# Patient Record
Sex: Female | Born: 1990 | Race: White | Hispanic: No | Marital: Single | Smoking: Former smoker
Health system: Southern US, Community
[De-identification: ages and names within clinical notes are randomized; demographics above are authoritative.]

## PROBLEM LIST (undated history)

## (undated) HISTORY — PX: NO PAST SURGERIES: SHX2092

---

## 2016-04-17 LAB — HM PAP SMEAR

## 2016-04-24 LAB — HM PAP SMEAR: HM PAP: POSITIVE

## 2016-07-22 DIAGNOSIS — N879 Dysplasia of cervix uteri, unspecified: Secondary | ICD-10-CM | POA: Insufficient documentation

## 2017-06-28 ENCOUNTER — Ambulatory Visit: Payer: Self-pay | Admitting: Physician Assistant

## 2017-07-14 ENCOUNTER — Ambulatory Visit (INDEPENDENT_AMBULATORY_CARE_PROVIDER_SITE_OTHER): Payer: Self-pay | Admitting: Physician Assistant

## 2017-07-14 ENCOUNTER — Encounter: Payer: Self-pay | Admitting: Physician Assistant

## 2017-07-14 VITALS — BP 126/74 | HR 104 | Temp 98.8°F | Resp 16 | Ht 65.0 in | Wt 121.0 lb

## 2017-07-14 DIAGNOSIS — Z598 Other problems related to housing and economic circumstances: Secondary | ICD-10-CM

## 2017-07-14 DIAGNOSIS — Z599 Problem related to housing and economic circumstances, unspecified: Secondary | ICD-10-CM

## 2017-07-14 DIAGNOSIS — F419 Anxiety disorder, unspecified: Secondary | ICD-10-CM

## 2017-07-14 DIAGNOSIS — B1920 Unspecified viral hepatitis C without hepatic coma: Secondary | ICD-10-CM

## 2017-07-14 MED ORDER — ESCITALOPRAM OXALATE 10 MG PO TABS: 10.0000 mg | ORAL_TABLET | Freq: Every day | ORAL | 0 refills | Status: DC

## 2017-07-14 NOTE — Patient Instructions (Signed)
Hepatitis C Hepatitis C is a viral infection of the liver. It can lead to scarring of the liver (cirrhosis), liver failure, or liver cancer. Hepatitis C may go undetected for months or years because people with the infection may not have symptoms, or they may have only mild symptoms. What are the causes? This condition is caused by the hepatitis C virus (HCV). The virus can spread from person to person (is contagious) through:  Blood.  Childbirth. A woman who has hepatitis C can pass it to her baby during birth.  Bodily fluids, such as breast milk, tears, semen, vaginal fluids, and saliva.  Blood transfusions or organ transplants done in the [place] before 1992.  What increases the risk? The following factors may make you more likely to develop this condition:  Having contact with unclean (contaminated) needles or syringes. This may result from: ? Acupuncture. ? Tattoing. ? Body piercing. ? Injecting drugs.  Having unprotected sex with someone who is infected.  Needing treatment to filter your blood (kidney dialysis).  Having HIV (human immunodeficiency virus) or AIDS (acquired immunodeficiency syndrome).  Working in a job that involves contact with blood or bodily fluids, such as health care.  What are the signs or symptoms? Symptoms of this condition include:  Fatigue.  Loss of appetite.  Nausea.  Vomiting.  Abdominal pain.  Dark yellow urine.  Yellowish skin and eyes (jaundice).  Itchy skin.  Clay-colored bowel movements.  Joint pain.  Bleeding and bruising easily.  Fluid building up in your stomach (ascites).  In some cases, you may not have any symptoms. How is this diagnosed? This condition is diagnosed with:  Blood tests.  Other tests to check how well your liver is functioning. They may include: ? Magnetic resonance elastography (MRE). This imaging test uses MRIs and sound waves to measure liver stiffness. ? Transient elastography. This  imaging test uses ultrasounds to measure liver stiffness. ? Liver biopsy. This test requires taking a small tissue sample from your liver to examine it under a microscope.  How is this treated? Your health care provider may perform noninvasive tests or a liver biopsy to help decide the best course of treatment. Treatment may include:  Antiviral medicines and other medicines.  Follow-up treatments every 6-12 months for infections or other liver conditions.  Receiving a donated liver (liver transplant).  Follow these instructions at home: Medicines  Take over-the-counter and prescription medicines only as told by your health care provider.  Take your antiviral medicine as told by your health care provider. Do not stop taking the antiviral even if you start to feel better.  Do not take any medicines unless approved by your health care provider, including over-the-counter medicines and birth control pills. Activity  Rest as needed.  Do not have sex unless approved by your health care provider.  Ask your health care provider when you may return to school or work. Eating and drinking  Eat a balanced diet with plenty of fruits and vegetables, whole grains, and lowfat (lean) meats or non-meat proteins (such as beans or tofu).  Drink enough fluids to keep your urine clear or pale yellow.  Do not drink alcohol. General instructions  Do not share toothbrushes, nail clippers, or razors.  Wash your hands frequently with soap and water. If soap and water are not available, use hand sanitizer.  Cover any cuts or open sores on your skin to prevent spreading the virus.  Keep all follow-up visits as told by your health care   provider. This is important. You may need follow-up visits every 6-12 months. How is this prevented? There is no vaccine for hepatitis C. The only way to prevent the disease is to reduce the risk of exposure to the virus. Make sure you:  Wash your hands frequently with  soap and water. If soap and water are not available, use hand sanitizer.  Do not share needles or syringes.  Practice safe sex and use condoms.  Avoid handling blood or bodily fluids without gloves or other protection.  Avoid getting tattoos or piercings in shops or other locations that are not clean.  Contact a health care provider if:  You have a fever.  You develop abdominal pain.  You pass dark urine.  You pass clay-colored stools.  You develop joint pain. Get help right away if:  You have increasing fatigue or weakness.  You lose your appetite.  You cannot eat or drink without vomiting.  You develop jaundice or your jaundice gets worse.  You bruise or bleed easily. Summary  Hepatitis C is a viral infection of the liver. It can lead to scarring of the liver (cirrhosis), liver failure, or liver cancer.  The hepatitis C virus (HCV) causes this condition. The virus can pass from person to person (is contagious).  You should not take any medicines unless approved by your health care provider. This includes over-the-counter medicines and birth control pills. This information is not intended to replace advice given to you by your health care provider. Make sure you discuss any questions you have with your health care provider. Document Released: 02/14/2000 Document Revised: 03/24/2016 Document Reviewed: 03/24/2016 Elsevier Interactive Patient Education  2018 Elsevier Inc.  

## 2017-07-14 NOTE — Progress Notes (Signed)
Patient: Angel Skinner Female    DOB: [birth year]   27 y.o.   MRN: [id number] Visit Date: 07/16/2017  Today's Provider: Trey Sailors, PA-C   Chief Complaint  Patient presents with  . Establish Care  . Anxiety  . Hepatitis C   Subjective:    Angel Skinner is a 27 y/o woman who presents today to establish care. She is living in [place] with her boyfriend of over a year, relationship going well. She has one 29 year old son. She works in Biomedical engineer.   She reports in 2017 she tested positive for hepatitis C. 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 She reports she is not using any drugs now. She reports she was not treated for hepatitis C and she did not have any further workup for this. She reports right now she does not have health insurance.   She also reports some ongoing depression and anxiety. She was in a troublesome living situation previously when she was using IV heroin. She also recently lost her grandfather, and it seems her grandmother is in poor health as well. She reports sadness, crying, lack of motivation. She has not been treated for anxiety or depression in the past. She is interested in this as well as counseling in the community.  She does not recall when her prior PAP smear was.    Anxiety  Presents for initial visit. The problem has been gradually worsening. Symptoms include decreased concentration, depressed mood, excessive worry, nervous/anxious behavior and panic. Patient reports no chest pain, confusion, insomnia, muscle tension, nausea, palpitations, shortness of breath or suicidal ideas.   Past treatments include nothing.       No Known Allergies   Current Outpatient Medications:  .  escitalopram (LEXAPRO) 10 MG tablet, Take 1 tablet (10 mg total) by mouth daily., Disp: 90 tablet, Rfl: 0  Review of Systems  Constitutional: Positive for fatigue.  HENT: Negative.   Eyes: Negative.   Respiratory: Negative.  Negative for  shortness of breath.   Cardiovascular: Negative.  Negative for chest pain and palpitations.  Gastrointestinal: Negative.  Negative for nausea.  Endocrine: Negative.   Genitourinary: Negative.   Musculoskeletal: Negative.   Skin: Negative.   Allergic/Immunologic: Negative.   Neurological: Negative.   Hematological: Negative.   Psychiatric/Behavioral: Positive for decreased concentration. Negative for agitation, behavioral problems, confusion, dysphoric mood, hallucinations, self-injury, sleep disturbance and suicidal ideas. The patient is nervous/anxious. The patient does not have insomnia and is not hyperactive.    Family History  Problem Relation Age of Onset  . Breast cancer Mother        Late 30-40's  . Healthy Brother   . Breast cancer Maternal Grandmother   . Cirrhosis Maternal Grandmother   . Colon cancer Maternal Grandmother   . Healthy Brother    Past Surgical History:  Procedure Laterality Date  . NO PAST SURGERIES      Social History   Tobacco Use  . Smoking status: Former Smoker    Types: Cigarettes    Last attempt to quit: 03/02/2016    Years since quitting: 1.3  . Smokeless tobacco: Never Used  Substance Use Topics  . Alcohol use: Yes    Frequency: Never    Comment: rarely   Objective:   BP 126/74 (BP Location: Right Arm, Patient Position: Sitting, Cuff Size: Normal)   Pulse (!) 104   Temp 98.8 F (37.1 C) (Oral)   Resp 16  Ht  (1.651 m)   Wt 121 lb (54.9 kg)   BMI 20.14 kg/m  Vitals:   07/14/17 1522  BP: 126/74  Pulse: (!) 104  Resp: 16  Temp: 98.8 F (37.1 C)  TempSrc: Oral  Weight: 121 lb (54.9 kg)  Height:  (1.651 m)     Physical Exam  Constitutional: She is oriented to person, place, and time. She appears well-developed and well-nourished.  Eyes: Conjunctivae are normal. Right eye exhibits no discharge. Left eye exhibits no discharge.  No scleral icterus.   Cardiovascular: Normal rate and regular rhythm.  Pulmonary/Chest:  Effort normal and breath sounds normal.  Abdominal: Soft. Bowel sounds are normal. She exhibits no distension. There is no tenderness.  Neurological: She is alert and oriented to person, place, and time.  Skin: Skin is warm and dry.  No jaundice.  Psychiatric: She has a normal mood and affect. Her behavior is normal.        Assessment & Plan:     1. Hepatitis C virus infection without hepatic coma, unspecified chronicity  Further workup as below. Will refer to infectious disease center in [place], which has better assistance with payment than local treatment. C3 referral for help with insurance and also local counseling.  - Ambulatory referral to [facility] - Comprehensive Metabolic Panel (CMET) - CBC with Differential - HIV antibody (with reflex) - Hepatitis c antibody (reflex) - Hepatitis B surface antibody - Hepatitis B Surface AntiGEN - Hepatitis B Core Antibody, total - INR/PT - Ambulatory referral to Infectious Disease  2. Financial difficulties  - Ambulatory referral to [facility]  3. Anxiety  - escitalopram (LEXAPRO) 10 MG tablet; Take 1 tablet (10 mg total) by mouth daily.  Dispense: 90 tablet; Refill: 0  Return in about 1 month (around 08/14/2017) for anxiety .  The entirety of the information documented in the History of Present Illness, Review of Systems and Physical Exam were personally obtained by me. Portions of this information were initially documented by Kavin Leech, CMA and reviewed by me for thoroughness and accuracy.        Trey Sailors, PA-C  [facility]

## 2017-07-15 LAB — CBC WITH DIFFERENTIAL/PLATELET
Basophils Absolute: 0 10*3/uL (ref 0.0–0.2)
Basos: 0 %
EOS (ABSOLUTE): 0.1 10*3/uL (ref 0.0–0.4)
Eos: 1 %
Hematocrit: 38.2 % (ref 34.0–46.6)
Hemoglobin: 12.7 g/dL (ref 11.1–15.9)
Immature Grans (Abs): 0 10*3/uL (ref 0.0–0.1)
Immature Granulocytes: 0 %
Lymphocytes Absolute: 2.2 10*3/uL (ref 0.7–3.1)
Lymphs: 34 %
MCH: 30 pg (ref 26.6–33.0)
MCHC: 33.2 g/dL (ref 31.5–35.7)
MCV: 90 fL (ref 79–97)
Monocytes Absolute: 0.3 10*3/uL (ref 0.1–0.9)
Monocytes: 4 %
Neutrophils Absolute: 3.8 10*3/uL (ref 1.4–7.0)
Neutrophils: 61 %
Platelets: 307 10*3/uL (ref 150–379)
RBC: 4.24 x10E6/uL (ref 3.77–5.28)
RDW: 13.4 % (ref 12.3–15.4)
WBC: 6.3 10*3/uL (ref 3.4–10.8)

## 2017-07-15 LAB — COMPREHENSIVE METABOLIC PANEL
ALT: 167 IU/L — ABNORMAL HIGH (ref 0–32)
AST: 70 IU/L — ABNORMAL HIGH (ref 0–40)
Albumin/Globulin Ratio: 1.7 (ref 1.2–2.2)
Albumin: 4.7 g/dL (ref 3.5–5.5)
Alkaline Phosphatase: 51 IU/L (ref 39–117)
BUN/Creatinine Ratio: 19 (ref 9–23)
BUN: 12 mg/dL (ref 6–20)
Bilirubin Total: <0.2 mg/dL (ref 0.0–1.2)
CO2: 20 mmol/L (ref 20–29)
Calcium: 10.6 mg/dL — ABNORMAL HIGH (ref 8.7–10.2)
Chloride: 106 mmol/L (ref 96–106)
Creatinine, Ser: 0.64 mg/dL (ref 0.57–1.00)
GFR calc Af Amer: 139 mL/min/{1.73_m2} (ref >59)
GFR calc non Af Amer: 121 mL/min/{1.73_m2} (ref >59)
Globulin, Total: 2.8 g/dL (ref 1.5–4.5)
Glucose: 103 mg/dL — ABNORMAL HIGH (ref 65–99)
Potassium: 4.1 mmol/L (ref 3.5–5.2)
Sodium: 143 mmol/L (ref 134–144)
Total Protein: 7.5 g/dL (ref 6.0–8.5)

## 2017-07-15 LAB — HEPATITIS B CORE ANTIBODY, TOTAL: Hep B Core Total Ab: NEGATIVE

## 2017-07-15 LAB — HIV ANTIBODY (ROUTINE TESTING W REFLEX): HIV Screen 4th Generation wRfx: NONREACTIVE

## 2017-07-15 LAB — HEPATITIS B SURFACE ANTIBODY,QUALITATIVE: Hep B Surface Ab, Qual: REACTIVE

## 2017-07-15 LAB — HEPATITIS B SURFACE ANTIGEN: Hepatitis B Surface Ag: NEGATIVE

## 2017-07-15 LAB — COMMENT2 - HEP PANEL

## 2017-07-15 LAB — PROTIME-INR
INR: 1.1 (ref 0.8–1.2)
Prothrombin Time: 11.1 s (ref 9.1–12.0)

## 2017-07-15 LAB — HEPATITIS C ANTIBODY (REFLEX): HCV Ab: >11 s/co ratio — ABNORMAL HIGH (ref 0.0–0.9)

## 2017-07-20 ENCOUNTER — Encounter: Payer: Self-pay | Admitting: Physician Assistant

## 2017-07-20 LAB — HCV RT-PCR, QUANT (NON-GRAPH)

## 2017-07-20 LAB — SPECIMEN STATUS REPORT

## 2017-07-20 LAB — HCV AB W REFLEX TO QUANT PCR: HCV Ab: >11 {s_co_ratio} — ABNORMAL HIGH (ref 0.0–0.9)

## 2017-07-22 ENCOUNTER — Telehealth: Payer: Self-pay | Admitting: *Deleted

## 2017-07-22 NOTE — Telephone Encounter (Signed)
-----   Message from Trey Sailors, [place] sent at 07/22/2017 11:25 AM EDT ----- Hepatitis C antibody came back positive, which patient expected. There wasn't enough blood to run RNA to confirm the viral load. It looks like she has an appointment with infectious disease clinic on 07/29/2017 and she will likely get follow up labs and ultrasound there. Her liver enzymes were elevated. CBC normal. HIV negative. Hepatitis B labs consistent with previous vaccination. INR normal.

## 2017-07-22 NOTE — Telephone Encounter (Signed)
No answer and no vm

## 2017-07-28 NOTE — Telephone Encounter (Signed)
Pt advised.   Thanks,   -Yulitza Shorts  

## 2017-07-29 ENCOUNTER — Other Ambulatory Visit: Payer: Self-pay | Admitting: Physician Assistant

## 2017-07-29 ENCOUNTER — Ambulatory Visit (INDEPENDENT_AMBULATORY_CARE_PROVIDER_SITE_OTHER): Payer: Medicaid Other | Admitting: Internal Medicine

## 2017-07-29 ENCOUNTER — Encounter: Payer: Self-pay | Admitting: Internal Medicine

## 2017-07-29 DIAGNOSIS — R87612 Low grade squamous intraepithelial lesion on cytologic smear of cervix (LGSIL): Secondary | ICD-10-CM

## 2017-07-29 DIAGNOSIS — N870 Mild cervical dysplasia: Secondary | ICD-10-CM

## 2017-07-29 DIAGNOSIS — B182 Chronic viral hepatitis C: Secondary | ICD-10-CM | POA: Insufficient documentation

## 2017-07-29 NOTE — Progress Notes (Signed)
LGSIL and CIN I 07/22/2016 in Care Everywhere, recommendations to follow up PAP/HPV in one year.

## 2017-07-29 NOTE — Patient Instructions (Signed)
Date 07/29/17  Dear Ms Lamarche, As discussed in the ID Clinic, your hepatitis C therapy will include the following medications:    sofosbuvir 400 mg/velpatasvir 100 mg (Epclusa)  or sofosbuvir/ledipasvir oral daily  Please note that ALL MEDICATIONS WILL START ON THE SAME DATE for a total of 8-12 weeks. ---------------------------------------------------------------- Your HCV Treatment Start Date: TBA   Your HCV genotype: unknown    Liver Fibrosis: TBD    ---------------------------------------------------------------- YOUR PHARMACY CONTACT (depending on your insurance):   [facility] [address] [place], [place] [address] Phone: [phone] Hours: Monday to Friday 7:30 am to 6:00 pm   Please always contact your pharmacy at least 3-4 business days before you run out of medications to ensure your next month's medication is ready or 1 week prior to running out if you receive it by mail.  Remember, each prescription is for 28 days. ---------------------------------------------------------------- GENERAL NOTES REGARDING YOUR HEPATITIS C MEDICATION:  SOFOSBUVIR (SOVALDI or EPCLUSA): - Sofosbuvir 400 mg tablet is taken daily with OR without food. - The sofosbuvir tablets are yellow. - The Epclusa tablets are pink, diamond-shaped - The tablets should be stored at room temperature. - The most common side effects with sofosbuvir or Epclusa include:      1. Fatigue      2. Headache      3. Nausea      4. Diarrhea      5. Insomnia  - Acid reducing agents such as H2 blockers (ie. Pepcid (famotidine), Zantac (ranitidine), Tagamet (cimetidine), Axid (nizatidine) and proton pump inhibitors (ie. Prilosec (omeprazole), Protonix (pantoprazole), Nexium (esomeprazole), or Aciphex (rabeprazole)) can decrease effectiveness of Harvoni. Do not take until you have discussed with a health care provider.    -Antacids that contain magnesium and/or aluminum hydroxide (ie. Milk of  Magensia, Rolaids, Gaviscon, Maalox, Mylanta, an dArthritis Pain Formula)can reduce absorption of sofosbuvir, so take them at least 4 hours before or after Harvoni.  -Calcium carbonate (calcium supplements or antacids such as Tums, Caltrate, Os-Cal)needs to be taken at least 4 hours hours before or after sofosbuvir.  -St. John's wort or any products that contain St. John's wort like some herbal supplements  Please inform the office prior to starting any of these medications.   Please note that this only lists the most common side effects and is NOT a comprehensive list of the potential side effects of these medications. For more information, please review the drug information sheets that come with your medication package from the pharmacy.  ---------------------------------------------------------------- GENERAL HELPFUL HINTS ON HCV THERAPY: 1. No alcohol. 2. Stay well-hydrated 3. Notify the ID Clinic of any changes in your other over-the-counter/herbal or prescription medications. 4. If you miss a dose of your medication, take the missed dose as soon as you remember. Return to your regular time/dose schedule the next day.  5.  Do not stop taking your medications without first talking with your healthcare provider. 6.  You may take Tylenol (acetaminophen), as long as the dose is less than 2000 mg (OR no more than 4 tablets of the Tylenol Extra Strengths  tablet) in 24 hours. 7. You will follow up with our clinic pharmacist initially after starting the medication to monitor for any possible side effects 8. You will get labs once during treatment, soon after treatment completion and again 6 months or more after treatment completion to verify the virus is completely gone.   Gardiner Barefoot, MD  [facility] [place], [place]  [id number] [phone]

## 2017-07-29 NOTE — Progress Notes (Signed)
[facility]   CC: consideration for treatment for chronic hepatitis C  HPI:  +Angel Skinner is a 27 y.o. female who presents for initial evaluation and management of chronic hepatitis C.  Patient tested positive last year. Hepatitis C-associated risk factors present are: IV drug abuse (details: last used over 1 year ago). Patient denies sexual contact with person with liver disease. Patient has not had other studies performed. Results: positive Ab only. Patient has not had prior treatment for Hepatitis C. Patient does not have a past history of liver disease. Patient does not have a family history of liver disease. Patient does not  have associated signs or symptoms related to liver disease.  Labs reviewed and confirm chronic hepatitis C Ab but quantity not sufficient for RNA.  Records reviewed from Epic and HIV negative, hepatitis B surface Ag negative, Ab positive.  CMP with transaminitis.      Patient does not have documented immunity to Hepatitis A. Patient does have documented immunity to Hepatitis B.    Review of Systems:  Constitutional: negative for fatigue and malaise Gastrointestinal: negative for diarrhea Integument/breast: negative for rash Musculoskeletal: negative for myalgias and arthralgias All other systems reviewed and are negative       History reviewed. No pertinent past medical history.  Prior to Admission medications   Medication Sig Start Date End Date Taking? Authorizing Provider  escitalopram (LEXAPRO) 10 MG tablet Take 1 tablet (10 mg total) by mouth daily. Patient not taking: Reported on 07/29/2017 07/14/17 10/12/17  Trey Sailors, PA-C    No Known Allergies  Social History   Tobacco Use  . Smoking status: Former Smoker    Types: Cigarettes    Last attempt to quit: 03/02/2016    Years since quitting: 1.4  . Smokeless tobacco: Never Used  Substance Use Topics  . Alcohol use: Yes    Frequency: Never    Comment: rarely    . Drug use: Never    Family History  Problem Relation Age of Onset  . Breast cancer Mother        Late 30-40's  . Healthy Brother   . Breast cancer Maternal Grandmother   . Cirrhosis Maternal Grandmother   . Colon cancer Maternal Grandmother   . Healthy Brother       Objective:  Constitutional: in no apparent distress and alert,  Vitals:   07/29/17 0850  BP: 113/77  Pulse: 78  Temp: 98 F (36.7 C)   Eyes: anicteric Cardiovascular: Cor RRR Respiratory: CTA B; normal respiratory effort Gastrointestinal: Bowel sounds are normal, liver is not enlarged, spleen is not enlarged Musculoskeletal: no pedal edema noted Skin: negatives: no rash; no porphyria cutanea tarda Lymphatic: no cervical lymphadenopathy   Laboratory Genotype: No results found for: HCVGENOTYPE HCV viral load: No results found for: HCVQUANT Lab Results  Component Value Date   WBC 6.3 07/14/2017   HGB 12.7 07/14/2017   HCT 38.2 07/14/2017   MCV 90 07/14/2017   PLT 307 07/14/2017    Lab Results  Component Value Date   CREATININE 0.64 07/14/2017   BUN 12 07/14/2017   NA 143 07/14/2017   K 4.1 07/14/2017   CL 106 07/14/2017   CO2 20 07/14/2017    Lab Results  Component Value Date   ALT 167 (H) 07/14/2017   AST 70 (H) 07/14/2017   ALKPHOS 51 07/14/2017     Labs and history reviewed and show CHILD-PUGH A  5-6 points: Child class  A 7-9 points: Child class B 10-15 points: Child class C  Lab Results  Component Value Date   INR 1.1 07/14/2017   BILITOT <0.2 07/14/2017   ALBUMIN 4.7 07/14/2017     Assessment: New Patient with Chronic Hepatitis C genotype unknown, untreated.  I discussed with the patient the lab findings that confirm chronic hepatitis C as well as the natural history and progression of disease including about 30% of people who develop cirrhosis of the liver if left untreated and once cirrhosis is established there is a 2-7% risk per year of liver cancer and liver failure.  I  discussed the importance of treatment and benefits in reducing the risk, even if significant liver fibrosis exists.   Plan: 1) Patient counseled extensively on limiting acetaminophen to no more than 2 grams daily, avoidance of alcohol. 2) Transmission discussed with patient including sexual transmission, sharing razors and toothbrush.   3) Will need referral to gastroenterology if concern for cirrhosis 4) Will need referral for substance abuse counseling: No.; Further work up to include urine drug screen  No. 5) Will prescribe appropriate medication based on genotype and coverage via [facility] once labs and ultrasound done 6) Hepatitis A titers 7) Further work up with ultrasound 8) will follow up after starting medication

## 2017-07-30 ENCOUNTER — Encounter: Payer: Self-pay | Admitting: Physician Assistant

## 2017-07-30 LAB — HEPATITIS A ANTIBODY, TOTAL: HEPATITIS A AB,TOTAL: NONREACTIVE

## 2017-08-02 ENCOUNTER — Ambulatory Visit: Payer: Medicaid Other

## 2017-08-02 LAB — HEPATITIS C GENOTYPE: HCV Genotype: 3

## 2017-08-02 LAB — HEPATITIS C RNA QUANTITATIVE
HCV Quantitative Log: 6.78 Log IU/mL — ABNORMAL HIGH
HCV RNA, PCR, QN: 5960000 IU/mL — ABNORMAL HIGH

## 2017-08-04 LAB — LIVER FIBROSIS, FIBROTEST-ACTITEST
ALT: 172 U/L — ABNORMAL HIGH (ref 6–29)
Alpha-2-Macroglobulin: 165 mg/dL (ref 106–279)
Apolipoprotein A1: 152 mg/dL (ref 101–198)
Bilirubin: 0.3 mg/dL (ref 0.2–1.2)
Fibrosis Score: 0.04
GGT: 16 U/L (ref 3–40)
HAPTOGLOBIN: 115 mg/dL (ref 43–212)
NECROINFLAMMAT ACT SCORE: 0.69
REFERENCE ID: 2494665

## 2017-08-27 ENCOUNTER — Ambulatory Visit: Payer: Self-pay | Attending: Internal Medicine

## 2017-08-27 ENCOUNTER — Telehealth: Payer: Self-pay | Admitting: *Deleted

## 2017-08-27 NOTE — Telephone Encounter (Signed)
Radiologist called to report that the patient did not show for her ultra sound today.

## 2017-08-30 NOTE — Telephone Encounter (Signed)
Ok.  Waiting on that before starting treatment.  thanks

## 2017-09-07 ENCOUNTER — Encounter: Payer: Self-pay | Admitting: Physician Assistant

## 2017-10-06 ENCOUNTER — Telehealth: Payer: Self-pay | Admitting: Physician Assistant

## 2017-10-06 NOTE — Telephone Encounter (Signed)
Can we call this patient? She no showed her ultrasound appointments and follow ups. Infectious disease clinic is waiting on her ultrasound to start treatment. She needs to get this, I can order it for her.

## 2017-10-11 NOTE — Telephone Encounter (Signed)
Tried calling; pt's mailbox is full.   Thanks,   -Evangelina Delancey  

## 2017-10-15 NOTE — Telephone Encounter (Signed)
I spoke with the patient about her Medicaid application. She applied online and was told she was approved for one-time visits.  She said that she has been out of town and just got back, so she is now trying to get everything straight.  She will be going to DSS to check on status of application and find out next steps. VDM (DD)

## 2017-11-29 NOTE — Progress Notes (Signed)
c      Patient: Angel Skinner Female    DOB: 04-22-1990   27 y.o.   MRN: [id number] Visit Date: 11/30/2017  Today's Provider: Trey Sailors, PA-C     Chief Complaint  Patient presents with  . Gynecologic Exam  . Follow-up    Hepatitis C virus   Subjective:    HPI  Hx of LGSIL: Had LGSIL come back on colposcopy with [facility] 07/22/2016. Patient has not had PAP smear since then. She denies vaginal bleeding.   Hepatitis C Treatment: Has seen infectious disease, has not had ultrasound which is what is needed to determine treatment. She has just been approved for Medicaid and reports this is pending.   Inattention: Reports she wants to be evaluated for ADHD. Says she cannot focus. Says she has a longstanding history of difficulty in school. She ended up attending a different school every year, was in special needs class. Dropped out of school in tenth grade due to academic difficulties. She attended community college where she reports she did better due to small class sizes, then became pregnant and did not finish GED. Says she doesn't start any projects now because she knows she will not finish them.   Anxiety: Patient was prescribed Lexapro 10 mg at last appointment. Reports she did not pick this medication up. She does report continued issues with anxiety. She reports her grandmother is doing better health condition wise and things have gotten a little bit better.     No Known Allergies  No current outpatient medications on file.  Review of Systems  Social History   Tobacco Use  . Smoking status: Former Smoker    Types: Cigarettes    Last attempt to quit: 03/02/2016    Years since quitting: 1.7  . Smokeless tobacco: Never Used  Substance Use Topics  . Alcohol use: Yes    Frequency: Never    Comment: rarely   Objective:   BP 110/70 (BP Location: Left Arm, Patient Position: Sitting, Cuff Size: Normal)   Pulse 63   Temp 98.4 F (36.9 C) (Oral)   Resp 16   Ht 5\' 5"  (1.651  m)   Wt 124 lb (56.2 kg)   SpO2 99%   BMI 20.63 kg/m  Vitals:   11/30/17 0815  BP: 110/70  Pulse: 63  Resp: 16  Temp: 98.4 F (36.9 C)  TempSrc: Oral  SpO2: 99%  Weight: 124 lb (56.2 kg)  Height: 5\' 5"  (1.651 m)     Physical Exam  Constitutional: She is oriented to person, place, and time. She appears well-developed and well-nourished.  Cardiovascular: Normal rate and regular rhythm.  Pulmonary/Chest: Effort normal and breath sounds normal.  Genitourinary: There is no rash, tenderness, lesion or injury on the right labia. There is no rash, tenderness, lesion or injury on the left labia. Cervix exhibits no motion tenderness, no discharge and no friability.  Neurological: She is alert and oriented to person, place, and time.  Skin: Skin is warm and dry.  Psychiatric: She has a normal mood and affect. Her behavior is normal.        Assessment & Plan:     1. LGSIL on Pap smear of cervix  - Cytology - PAP  2. Need for influenza vaccination  - Flu Vaccine QUAD 36+ mos IM  3. Need for Tdap vaccination  - Tdap vaccine greater than or equal to 7yo IM  4. Inattention  - Ambulatory referral to Psychology  5. Chronic hepatitis  C without hepatic coma (HCC)  She needs ultrasound to determine treatment.   - US Abdomen Complete; Future - Ambulatory referral to Chronic Care Management Services  6. Anxiety  She can take this if she thinks it will help.   - escitalopram (LEXAPRO) 10 MG tablet; Take 1 tablet (10 mg total) by mouth daily.  Dispense: 90 tablet; Refill: 0  I have spent 25 minutes with this patient, >50% of which was spent with counseling and coordination of care.  Return in about 4 months (around 04/02/2018) for adhd, anxiety .  The entirety of the information documented in the History of Present Illness, Review of Systems and Physical Exam were personally obtained by me. Portions of this information were initially documented by Rondel Baton, CMA and  reviewed by me for thoroughness and accuracy.         Trey Sailors, PA-C  [facility]

## 2017-11-30 ENCOUNTER — Encounter: Payer: Self-pay | Admitting: Physician Assistant

## 2017-11-30 ENCOUNTER — Other Ambulatory Visit (HOSPITAL_COMMUNITY)
Admission: RE | Admit: 2017-11-30 | Discharge: 2017-11-30 | Disposition: A | Discharge status: Home / Self Care | Payer: Medicaid Other | Source: Ambulatory Visit | Attending: Physician Assistant | Admitting: Physician Assistant

## 2017-11-30 ENCOUNTER — Ambulatory Visit: Payer: Self-pay | Admitting: Physician Assistant

## 2017-11-30 VITALS — BP 110/70 | HR 63 | Temp 98.4°F | Resp 16 | Ht 65.0 in | Wt 124.0 lb

## 2017-11-30 DIAGNOSIS — R41840 Attention and concentration deficit: Secondary | ICD-10-CM

## 2017-11-30 DIAGNOSIS — R87612 Low grade squamous intraepithelial lesion on cytologic smear of cervix (LGSIL): Secondary | ICD-10-CM

## 2017-11-30 DIAGNOSIS — Z23 Encounter for immunization: Secondary | ICD-10-CM

## 2017-11-30 DIAGNOSIS — F419 Anxiety disorder, unspecified: Secondary | ICD-10-CM

## 2017-11-30 DIAGNOSIS — B182 Chronic viral hepatitis C: Secondary | ICD-10-CM

## 2017-11-30 MED ORDER — ESCITALOPRAM OXALATE 10 MG PO TABS: 10.0000 mg | ORAL_TABLET | Freq: Every day | ORAL | 0 refills | Status: AC

## 2017-11-30 NOTE — Patient Instructions (Signed)

## 2017-12-01 ENCOUNTER — Ambulatory Visit: Payer: Self-pay | Admitting: Pharmacist

## 2017-12-01 DIAGNOSIS — B182 Chronic viral hepatitis C: Secondary | ICD-10-CM

## 2017-12-01 DIAGNOSIS — F419 Anxiety disorder, unspecified: Secondary | ICD-10-CM

## 2017-12-01 LAB — CYTOLOGY - PAP
Diagnosis: NEGATIVE
HPV: NOT DETECTED

## 2017-12-01 NOTE — Chronic Care Management (AMB) (Signed)
  Chronic Care Management   Note  12/01/2017 Name: Angel Skinner MRN: [id number] DOB: 02-11-1991    Care Management Note    27 y.o. year old female referred to Chronic Care Management by Osvaldo Angst for community resources related to Hepatitis C and anxiety. Chronic conditions include hepatitis C, anxiety. Last office visit with Trey Sailors, PA-C was 11/30/17, referral placed 11/30/17.   Was unable to reach patient via telephone today. Unfortunately, voicemail was full and unable to accept messages. (unsuccessful outreach #1).  Plan: Will follow-up within 3-5  business days via telephone.   Karalee Height, PharmD Clinical Pharmacist [facility] [phone]

## 2017-12-02 ENCOUNTER — Telehealth: Payer: Self-pay

## 2017-12-02 NOTE — Telephone Encounter (Signed)
I called [facility] [phone]) and requested that HPV be added. Cytology department requires an add-on form to be faxed to them. I requested that they fax an  add-on form to our office so that we can complete. Awaiting form.

## 2017-12-02 NOTE — Telephone Encounter (Signed)
-----   Message from Trey Sailors, [place] sent at 12/02/2017  3:51 PM EDT ----- Can we please add HPV testing to this?

## 2017-12-03 NOTE — Telephone Encounter (Signed)
Faxed order to cytology dept

## 2017-12-07 ENCOUNTER — Ambulatory Visit
Admission: RE | Admit: 2017-12-07 | Discharge: 2017-12-07 | Disposition: A | Discharge status: Home / Self Care | Payer: Self-pay | Source: Ambulatory Visit | Attending: Physician Assistant | Admitting: Physician Assistant

## 2017-12-07 DIAGNOSIS — B182 Chronic viral hepatitis C: Secondary | ICD-10-CM | POA: Insufficient documentation

## 2017-12-08 ENCOUNTER — Telehealth: Payer: Self-pay

## 2017-12-08 ENCOUNTER — Ambulatory Visit: Payer: Self-pay | Admitting: Pharmacist

## 2017-12-08 NOTE — Telephone Encounter (Signed)
Mailbox full

## 2017-12-08 NOTE — Telephone Encounter (Signed)
-----   Message from Trey Sailors, [place] sent at 12/07/2017  4:11 PM EDT ----- Please let patient know that her abdominal ultrasound was normal. I am forwarding this result to her infectious disease doctor, Dr. Luciana Axe, as he was awaiting this ultrasound to choose treatment.

## 2017-12-08 NOTE — Telephone Encounter (Signed)
-----   Message from Trey Sailors, [place] sent at 12/08/2017  9:27 AM EDT ----- PAP result is normal, HPV negative. Repeat PAP smear in one year.

## 2017-12-08 NOTE — Chronic Care Management (AMB) (Signed)
  Care Management   Note  12/08/2017 Name: Angel Skinner MRN: [id number] DOB: February 24, 1991   27 y.o. year old female referred to Chronic Care Management by Osvaldo Angst for community resources related to Hepatitis C and anxiety. Chronic conditions include hepatitis C, anxiety. Last office visit with Trey Sailors, PA-C was 11/30/17, referral placed 11/30/17.   Was unable to reach patient via telephone today. Unfortunately, voicemail was full and unable to accept messages. (unsuccessful outreach #2).  Plan: Will follow-up with third and final outreach attempt within 3-5  business days via telephone  Karalee Height, PharmD Clinical Pharmacist [facility] [phone]

## 2017-12-09 ENCOUNTER — Encounter: Payer: Self-pay | Admitting: Physician Assistant

## 2017-12-10 ENCOUNTER — Other Ambulatory Visit: Payer: Self-pay | Admitting: Internal Medicine

## 2017-12-10 MED ORDER — SOFOSBUVIR-VELPATASVIR 400-100 MG PO TABS: 1.0000 | ORAL_TABLET | Freq: Every day | ORAL | 2 refills | Status: AC

## 2017-12-10 MED ORDER — SOFOSBUVIR-VELPATASVIR 400-100 MG PO TABS: 1.0000 | ORAL_TABLET | Freq: Every day | ORAL | 2 refills | Status: DC

## 2017-12-13 ENCOUNTER — Encounter: Payer: Self-pay | Admitting: Pharmacist

## 2017-12-13 ENCOUNTER — Ambulatory Visit: Payer: Self-pay | Admitting: Pharmacist

## 2017-12-13 DIAGNOSIS — B182 Chronic viral hepatitis C: Secondary | ICD-10-CM

## 2017-12-13 DIAGNOSIS — F419 Anxiety disorder, unspecified: Secondary | ICD-10-CM

## 2017-12-13 NOTE — Chronic Care Management (AMB) (Signed)
    Care Management Note   27 y.o. year old female referred to Chronic Care Management by Osvaldo Angst for community resources related to Hepatitis C and anxiety. Chronic conditions include hepatitis C, anxiety. Last office visit with Trey Sailors, PA-C was 11/30/17.   Was unable to reach patient via telephone today and voicemail was full and unable to accept messagesl. (unsuccessful outreach #3).  Plan: I have sent patient a MyChart message as voicemail remains full. I am happy to assist the patient in the future as needed.   Karalee Height, PharmD Clinical Pharmacist [facility] [phone]

## 2017-12-13 NOTE — Telephone Encounter (Signed)
Lab result mailed  

## 2017-12-30 ENCOUNTER — Encounter: Payer: Self-pay | Admitting: Pharmacy Technician

## 2018-02-02 ENCOUNTER — Ambulatory Visit (INDEPENDENT_AMBULATORY_CARE_PROVIDER_SITE_OTHER): Payer: Self-pay | Admitting: Pharmacist

## 2018-02-02 DIAGNOSIS — B182 Chronic viral hepatitis C: Secondary | ICD-10-CM

## 2018-02-02 NOTE — Progress Notes (Signed)
HPI: Angel Skinner is a 27 y.o. female who presents to the [facility] pharmacy clinic for follow-up of her Hepatitis C infection.  Medication: Epclusa x 12 weeks  Start Date: 12/28/17  Hepatitis C Genotype: 3  Fibrosis Score: F0  Hepatitis C RNA: 5.9 million on 5/30  Patient Active Problem List   Diagnosis Date Noted  . Chronic hepatitis C without hepatic coma (HCC) 07/29/2017  . Cervical dysplasia 07/22/2016    Patient's Medications  New Prescriptions   No medications on file  Previous Medications   ESCITALOPRAM (LEXAPRO) 10 MG TABLET    Take 1 tablet (10 mg total) by mouth daily.   SOFOSBUVIR-VELPATASVIR (EPCLUSA) 400-100 MG TABS    Take 1 tablet by mouth daily.  Modified Medications   No medications on file  Discontinued Medications   No medications on file    Allergies: No Known Allergies  Past Medical History: No past medical history on file.  Social History: Social History   Socioeconomic History  . Marital status: Single    Spouse name: Not on file  . Number of children: Not on file  . Years of education: Not on file  . Highest education level: Not on file  Occupational History  . Not on file  Social Needs  . Financial resource strain: Not on file  . Food insecurity:    Worry: Not on file    Inability: Not on file  . Transportation needs:    Medical: Not on file    Non-medical: Not on file  Tobacco Use  . Smoking status: Former Smoker    Types: Cigarettes    Last attempt to quit: 03/02/2016    Years since quitting: 1.9  . Smokeless tobacco: Never Used  Substance and Sexual Activity  . Alcohol use: Yes    Frequency: Never    Comment: rarely  . Drug use: Never  . Sexual activity: Not on file  Lifestyle  . Physical activity:    Days per week: Not on file    Minutes per session: Not on file  . Stress: Not on file  Relationships  . Social connections:    Talks on phone: Not on file    Gets together: Not on file    Attends religious service:  Not on file    Active member of club or organization: Not on file    Attends meetings of clubs or organizations: Not on file    Relationship status: Not on file  Other Topics Concern  . Not on file  Social History Narrative  . Not on file    Labs: Hepatitis C Lab Results  Component Value Date   HCVGENOTYPE 3 07/29/2017   HCVRNAPCRQN 5,960,000 (H) 07/29/2017   FIBROSTAGE F0 07/29/2017   Hepatitis B Lab Results  Component Value Date   HEPBSAG Negative 07/14/2017   HEPBCAB Negative 07/14/2017   Hepatitis A Lab Results  Component Value Date   HAV NON-REACTIVE 07/29/2017   HIV Lab Results  Component Value Date   HIV Non Reactive 07/14/2017   Lab Results  Component Value Date   CREATININE 0.52 02/02/2018   CREATININE 0.64 07/14/2017   Lab Results  Component Value Date   AST 14 02/02/2018   AST 70 (H) 07/14/2017   ALT 14 02/02/2018   ALT 172 (H) 07/29/2017   ALT 167 (H) 07/14/2017   INR 1.1 07/14/2017    Assessment: Angel Skinner is here today to follow-up for her Hep C infection.  She started 12 weeks  of Epclusa back on 10/29.  She reports no missed doses and is on her 2nd bottle. No issues getting it from the pharmacy.  She does say that it makes her a little nauseated in the morning but that is wearing off over time.  Offered to send in some Zofran for her if it gets to that point.  She is also having some headaches but nothing she cannot handle.  Encouraged continued compliance and congratulated her for not missing any doses so far.  Will check a Hep C viral load and CMET today and have her come back and see me at [facility].  She does have [facility] financial lab assistance.   Plan: - Continue Epclusa x 12 weeks - Hep C RNA and CMET today - F/u with me again 1/28 at 230pm  Angel Skinner L. Allayah Raineri, PharmD, BCIDP, AAHIVP, CPP Infectious Diseases Clinical Pharmacist [facility] 02/04/2018, 2:03 PM

## 2018-02-05 LAB — HEPATITIS C RNA QUANTITATIVE
HCV Quantitative Log: <1.18 Log IU/mL
HCV RNA, PCR, QN: <15 IU/mL

## 2018-02-05 LAB — COMPREHENSIVE METABOLIC PANEL
AG Ratio: 1.4 (calc) (ref 1.0–2.5)
ALBUMIN MSPROF: 4.2 g/dL (ref 3.6–5.1)
ALKALINE PHOSPHATASE (APISO): 38 U/L (ref 33–115)
ALT: 14 U/L (ref 6–29)
AST: 14 U/L (ref 10–30)
BUN: 11 mg/dL (ref 7–25)
CALCIUM: 10.2 mg/dL (ref 8.6–10.2)
CO2: 27 mmol/L (ref 20–32)
Chloride: 105 mmol/L (ref 98–110)
Creat: 0.52 mg/dL (ref 0.50–1.10)
GLOBULIN: 2.9 g/dL (ref 1.9–3.7)
GLUCOSE: 74 mg/dL (ref 65–99)
Potassium: 4.5 mmol/L (ref 3.5–5.3)
SODIUM: 140 mmol/L (ref 135–146)
Total Bilirubin: 0.3 mg/dL (ref 0.2–1.2)
Total Protein: 7.1 g/dL (ref 6.1–8.1)

## 2018-03-25 ENCOUNTER — Other Ambulatory Visit: Payer: Self-pay

## 2018-03-25 ENCOUNTER — Emergency Department
Admission: EM | Admit: 2018-03-25 | Discharge: 2018-03-25 | Disposition: A | Discharge status: Home / Self Care | Payer: Medicaid Other | Attending: Emergency Medicine | Admitting: Emergency Medicine

## 2018-03-25 DIAGNOSIS — Z87891 Personal history of nicotine dependence: Secondary | ICD-10-CM | POA: Insufficient documentation

## 2018-03-25 DIAGNOSIS — R200 Anesthesia of skin: Secondary | ICD-10-CM | POA: Insufficient documentation

## 2018-03-25 DIAGNOSIS — R202 Paresthesia of skin: Secondary | ICD-10-CM

## 2018-03-25 DIAGNOSIS — Z79899 Other long term (current) drug therapy: Secondary | ICD-10-CM | POA: Insufficient documentation

## 2018-03-25 LAB — BASIC METABOLIC PANEL
Anion gap: 9 (ref 5–15)
CHLORIDE: 106 mmol/L (ref 98–111)
CO2: 23 mmol/L (ref 22–32)
Calcium: 8.9 mg/dL (ref 8.9–10.3)
Creatinine, Ser: 0.44 mg/dL (ref 0.44–1.00)
GFR calc Af Amer: >60 mL/min (ref >60)
GLUCOSE: 96 mg/dL (ref 70–99)
Potassium: 3.3 mmol/L — ABNORMAL LOW (ref 3.5–5.1)
SODIUM: 138 mmol/L (ref 135–145)

## 2018-03-25 LAB — CBC
HCT: 34.5 % — ABNORMAL LOW (ref 36.0–46.0)
Hemoglobin: 11.8 g/dL — ABNORMAL LOW (ref 12.0–15.0)
MCH: 30 pg (ref 26.0–34.0)
MCHC: 34.2 g/dL (ref 30.0–36.0)
MCV: 87.8 fL (ref 80.0–100.0)
PLATELETS: 291 10*3/uL (ref 150–400)
RBC: 3.93 MIL/uL (ref 3.87–5.11)
RDW: 13.2 % (ref 11.5–15.5)
WBC: 5 10*3/uL (ref 4.0–10.5)
nRBC: 0 % (ref 0.0–0.2)

## 2018-03-25 NOTE — ED Notes (Signed)

## 2018-03-25 NOTE — ED Triage Notes (Addendum)
Pt comes via POV from home with c/o left hand numbness from the wrist down. Pt states this started Wednesday. Pt states she was shooting up Wednesday morning and ended up overdosing. Pt states friend call 911 and CPR was indicated. Pt states she was not given Narcan and when EMS arrived she had come to. Pt states she wasn't transported to hospital. Pt states later that night she noticed the tingling and numbness in her hand.  Pt states it hasn't gotten any better. Pt able to move fingers and hand. Pt states it is just tingling and numb. No redness or swelling noted to area.  Pt denies any chest pain, SOB, headache, blurred vision.

## 2018-03-25 NOTE — ED Provider Notes (Signed)
[facility] Emergency Department Provider Note   ____________________________________________   I have reviewed the triage vital signs and the nursing notes.   HISTORY  Chief Complaint Numbness   History limited by: Not Limited   HPI Angel Skinner is a 28 y.o. female who presents to the emergency department today because of concerns for numbness to her left hand.  Patient states the symptoms started 2 evenings ago.  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  She states it was a significant overdose and that another person who was there started CPR although by the time EMS got there she was awake and breathing.  It does not sound like Narcan has been administered.  She was shooting up into her left arm.  She denies any pain in her left arm feels like her fingers are asleep.  The patient denies similar symptoms in the past.  She denies any other weakness or numbness.  Denies any headache.   Per medical record review patient has a history of hepatits c  History reviewed. No pertinent past medical history.  Patient Active Problem List   Diagnosis Date Noted  . Chronic hepatitis C without hepatic coma (HCC) 07/29/2017  . Cervical dysplasia 07/22/2016    Past Surgical History:  Procedure Laterality Date  . NO PAST SURGERIES      Prior to Admission medications   Medication Sig Start Date End Date Taking? Authorizing Provider  escitalopram (LEXAPRO) 10 MG tablet Take 1 tablet (10 mg total) by mouth daily. 11/30/17 02/28/18  Trey Sailors, PA-C  Sofosbuvir-Velpatasvir (EPCLUSA) 400-100 MG TABS Take 1 tablet by mouth daily. 12/10/17   Comer, Belia Heman, MD    Allergies Patient has no known allergies.  Family History  Problem Relation Age of Onset  . Breast cancer Mother        Late 30-40's  . Healthy Brother   . Breast cancer Maternal Grandmother   . Cirrhosis Maternal Grandmother   . Colon cancer  Maternal Grandmother   . Healthy Brother     Social History Social History   Tobacco Use  . Smoking status: Former Smoker    Types: Cigarettes    Last attempt to quit: 03/02/2016    Years since quitting: 2.0  . Smokeless tobacco: Never Used  Substance Use Topics  . Alcohol use: Yes    Frequency: Never    Comment: rarely  . Drug use: Never    Review of Systems Constitutional: No fever/chills Eyes: No visual changes. ENT: No sore throat. Cardiovascular: Denies chest pain. Respiratory: Denies shortness of breath. Gastrointestinal: No abdominal pain.  No nausea, no vomiting.  No diarrhea.   Genitourinary: Negative for dysuria. Musculoskeletal: Negative for back pain. Skin: Negative for rash. Neurological: Positive for left hand numbness.  ____________________________________________   PHYSICAL EXAM:  VITAL SIGNS: ED Triage Vitals  Enc Vitals Group     BP 03/25/18 1639 140/85     Pulse Rate 03/25/18 1639 (!) 104     Resp 03/25/18 1639 19     Temp 03/25/18 1639 98.5 F (36.9 C)     Temp Source 03/25/18 1639 Oral     SpO2 03/25/18 1639 100 %     Weight 03/25/18 1634 118 lb (53.5 kg)     Height 03/25/18 1634 5\' 5"  (1.651 m)     Head Circumference --      Peak Flow --  Pain Score 03/25/18 1633 3   Constitutional: Alert and oriented.  Eyes: Conjunctivae are normal.  ENT      Head: Normocephalic and atraumatic.      Nose: No congestion/rhinnorhea.      Mouth/Throat: Mucous membranes are moist.      Neck: No stridor. Cardiovascular: Normal rate, regular rhythm.  No murmurs, rubs, or gallops. Respiratory: Normal respiratory effort without tachypnea nor retractions. Breath sounds are clear and equal bilaterally. No wheezes/rales/rhonchi. Genitourinary: Deferred Musculoskeletal: Normal range of motion in all extremities.  Neurologic:  Normal speech and language. No gross focal neurologic deficits are appreciated. Sensation to light touch and two point discrimination  intact to left finger pads. Grip strength 5/5 in left hand.  Skin:  Skin is warm, dry and intact. Injection mark in left forarm. Psychiatric: Mood and affect are normal. Speech and behavior are normal. Patient exhibits appropriate insight and judgment.  ____________________________________________    LABS (pertinent positives/negatives)  BMP wnl except k 3.3, BUN <5 CBC wbc 5.0, hgb 11.8, plt 291  ____________________________________________   EKG  None  ____________________________________________    RADIOLOGY  None  ____________________________________________   PROCEDURES  Procedures  ____________________________________________   INITIAL IMPRESSION / ASSESSMENT AND PLAN / ED COURSE  Pertinent labs & imaging results that were available during my care of the patient were reviewed by me and considered in my medical decision making (see chart for details).   Patient presented to the emergency department today because of concerns for left facial paresthesias.  On exam patient has good sensation to both light touch and two-point discrimination to that left hand.  She was shooting up in that left forearm.  At this point I doubt hypoxic brain injury given focal nature of deficits.  Do wonder if patient irritated a nerve when she was injecting.  At this point I think it is safe for patient be discharged home.  Will give patient neurology follow-up.   ____________________________________________   FINAL CLINICAL IMPRESSION(S) / ED DIAGNOSES  Final diagnoses:  Paresthesia     Note: This dictation was prepared with Dragon dictation. Any transcriptional errors that result from this process are unintentional     Phineas Semen, MD 03/25/18 [phone]

## 2018-03-25 NOTE — Discharge Instructions (Addendum)
Please seek medical attention for any high fevers, chest pain, shortness of breath, change in behavior, persistent vomiting, bloody stool or any other new or concerning symptoms.  

## 2018-03-25 NOTE — ED Triage Notes (Signed)
FIRST NURSE NOTE-left hand numb X 2 days. Ambulatory. NAD.

## 2018-03-29 ENCOUNTER — Ambulatory Visit: Payer: Medicaid Other | Admitting: Pharmacist

## 2018-04-04 ENCOUNTER — Ambulatory Visit: Payer: Medicaid Other | Admitting: Physician Assistant

## 2020-04-24 IMAGING — US US ABDOMEN COMPLETE
1 series · 14 of 25 positions shown · non-contrast
Comparison: None.

CLINICAL DATA: Chronic hepatitis C.

EXAM:
ABDOMEN ULTRASOUND COMPLETE

[Series 1: us abdomen complete · 0.17mm/px · 14 of 95 slices shown]
[im 1/95]
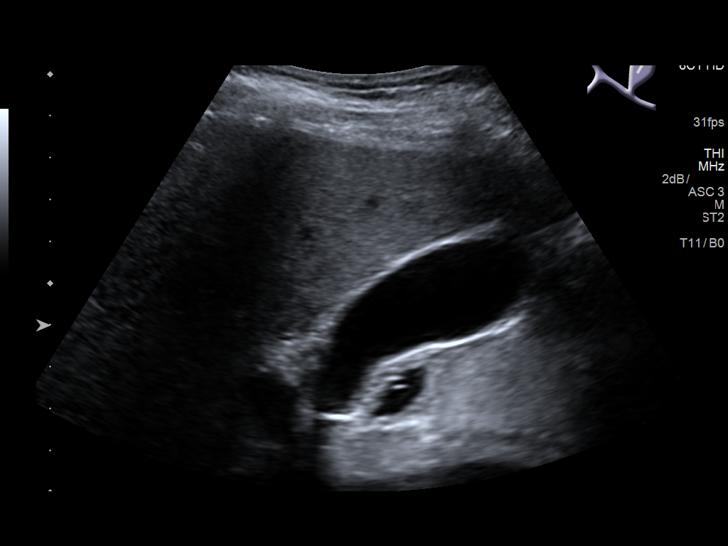
[im 8/95]
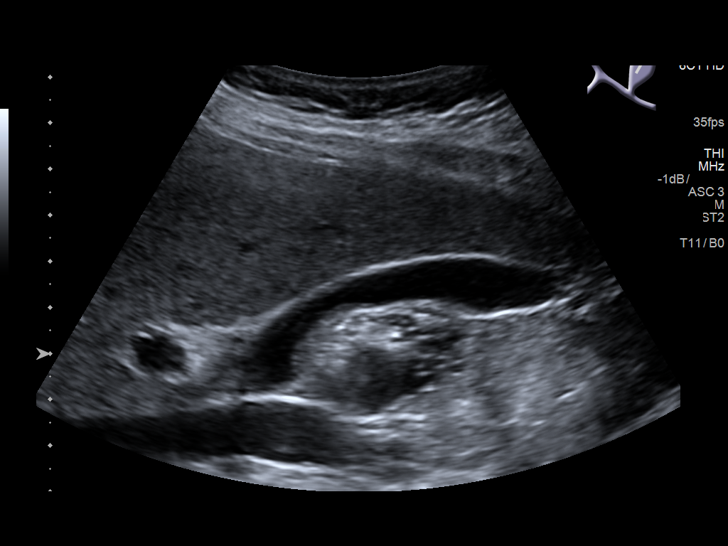
[im 16/95]
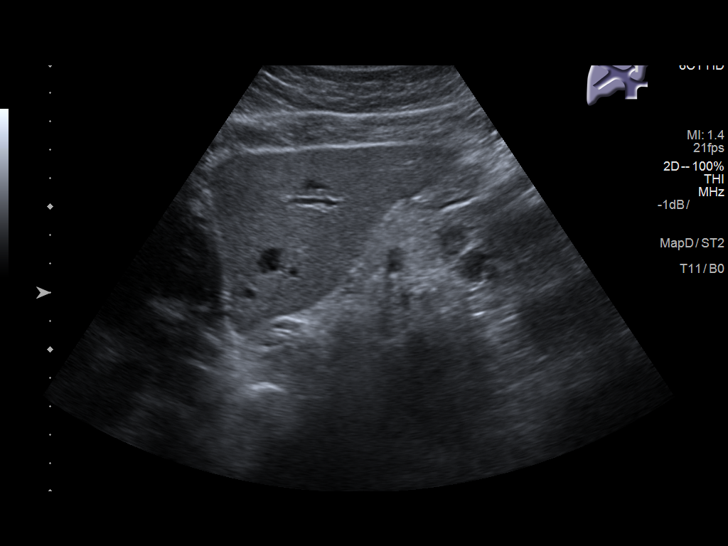
[im 24/95]
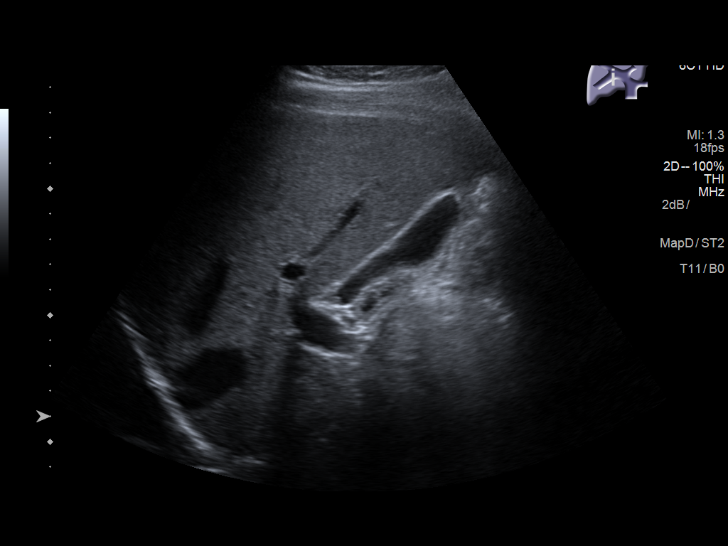
[im 32/95]
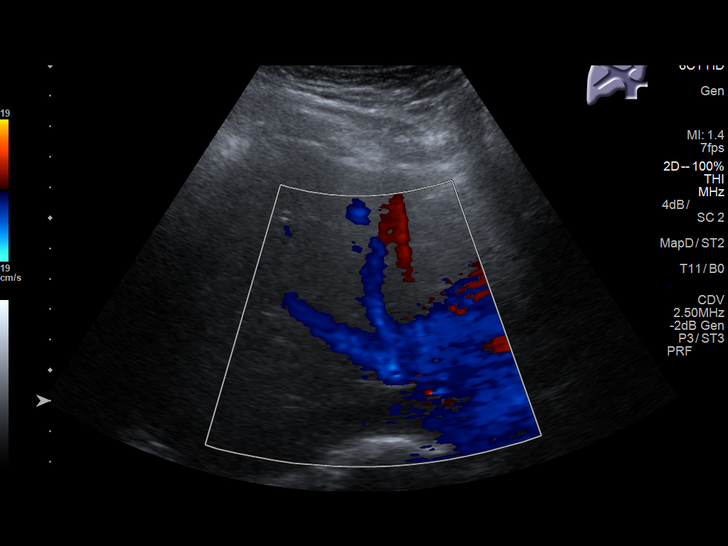
[im 36/95]
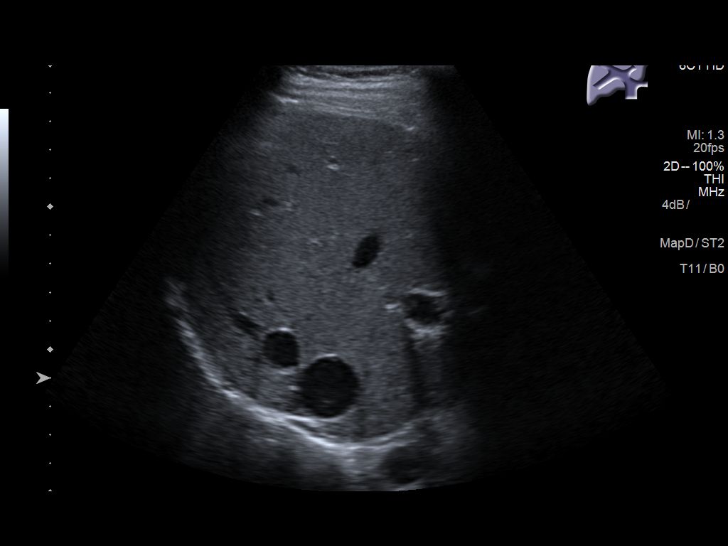
[im 44/95]
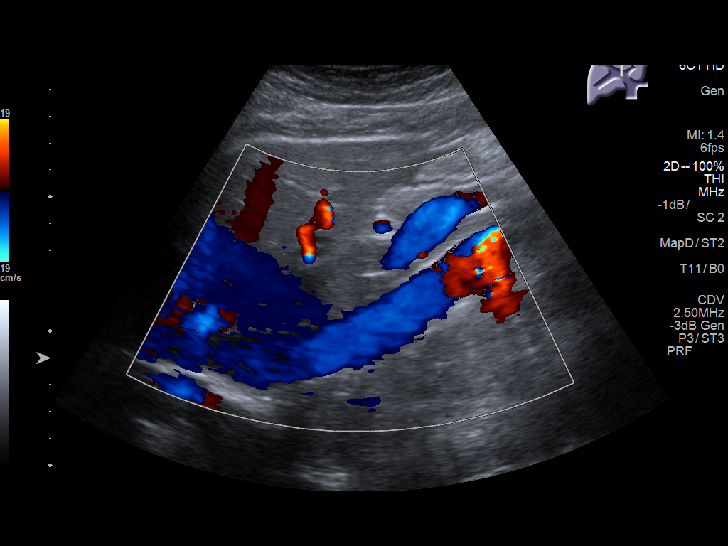
[im 51/95]
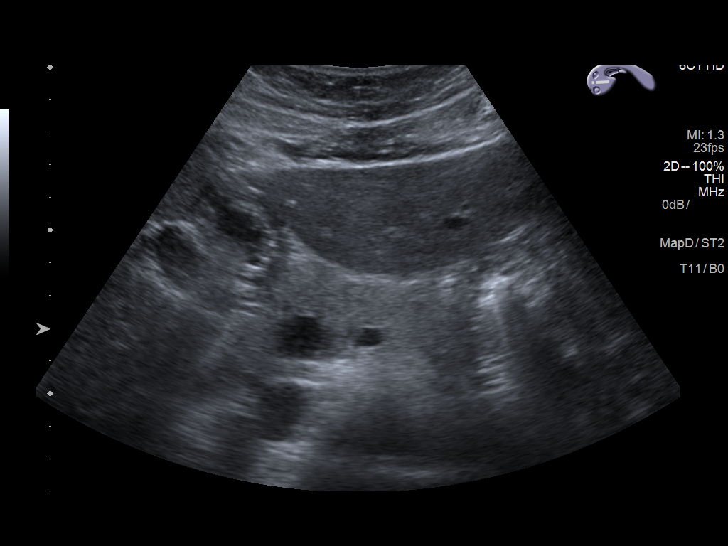
[im 59/95]
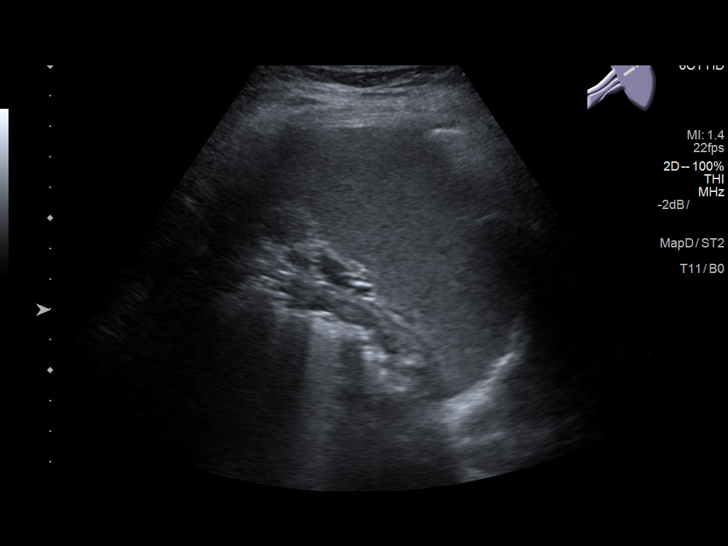
[im 63/95]
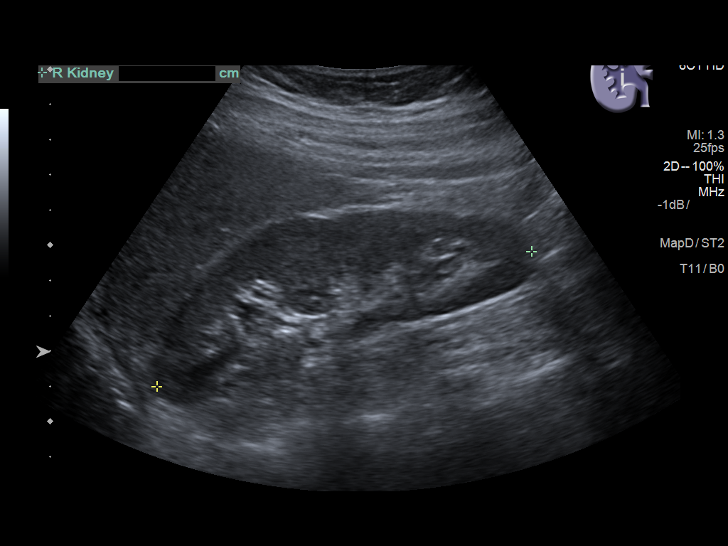
[im 71/95]
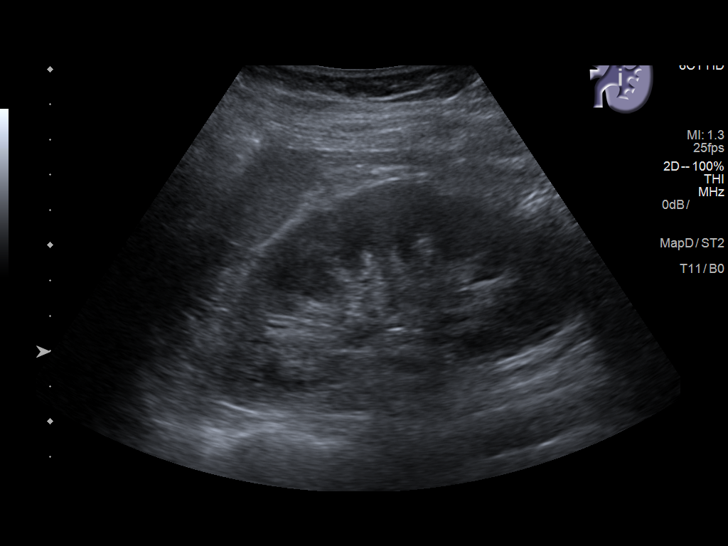
[im 79/95]
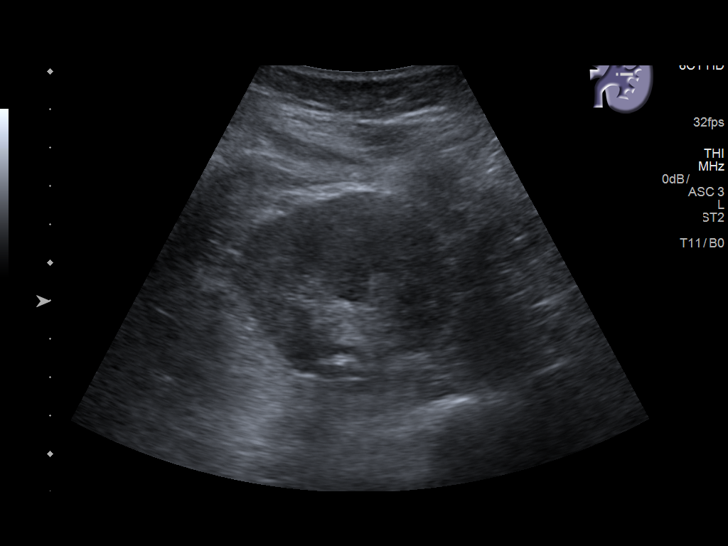
[im 87/95]
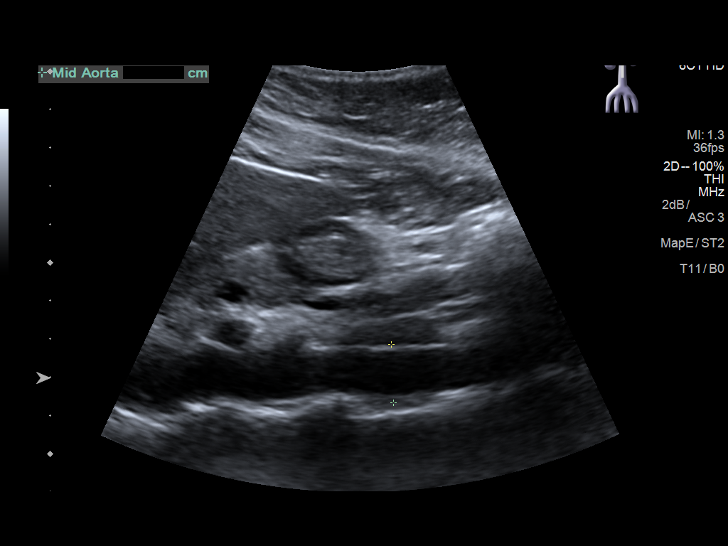
[im 95/95]
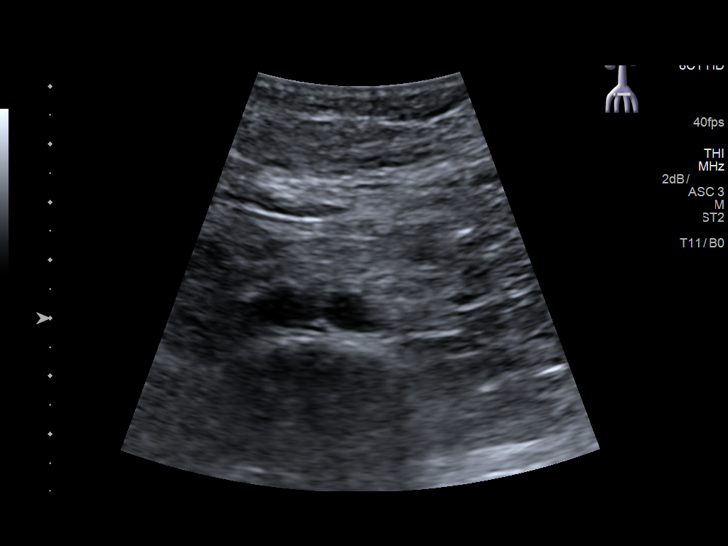

[14 of 25 positions shown; findings below may reference images not displayed]

FINDINGS: Gallbladder: No gallstones or wall thickening visualized. No
sonographic Murphy sign noted by sonographer.

Common bile duct: Diameter: 0.3 cm

Liver: No focal lesion identified. Within normal limits in
parenchymal echogenicity. Portal vein is patent on color Doppler
imaging with normal direction of blood flow towards the liver.

IVC: No abnormality visualized.

Pancreas: Visualized portion unremarkable.

Spleen: Size and appearance within normal limits.

Right Kidney: Length: 11.3 cm. Echogenicity within normal limits. No
mass or hydronephrosis visualized.

Left Kidney: Length: 10.9 cm. Echogenicity within normal limits. No
mass or hydronephrosis visualized.

Abdominal aorta: No aneurysm visualized.

Other findings: None.
IMPRESSION: Normal exam.
